# Patient Record
Sex: Female | Born: 1985
Health system: Southern US, Community
[De-identification: ages and names within clinical notes are randomized; demographics above are authoritative.]

## PROBLEM LIST (undated history)

## (undated) DIAGNOSIS — C73 Malignant neoplasm of thyroid gland: Secondary | ICD-10-CM

## (undated) DIAGNOSIS — F32A Depression, unspecified: Secondary | ICD-10-CM

## (undated) HISTORY — PX: THYROIDECTOMY: SHX17

## (undated) HISTORY — DX: Depression, unspecified: F32.A

## (undated) HISTORY — DX: Malignant neoplasm of thyroid gland: C73

---

## 2018-06-11 DIAGNOSIS — Z113 Encounter for screening for infections with a predominantly sexual mode of transmission: Secondary | ICD-10-CM | POA: Diagnosis not present

## 2018-06-11 DIAGNOSIS — Z20828 Contact with and (suspected) exposure to other viral communicable diseases: Secondary | ICD-10-CM | POA: Diagnosis not present

## 2019-04-29 ENCOUNTER — Emergency Department (HOSPITAL_BASED_OUTPATIENT_CLINIC_OR_DEPARTMENT_OTHER): Payer: 59

## 2019-04-29 ENCOUNTER — Emergency Department (HOSPITAL_COMMUNITY)
Admission: EM | Admit: 2019-04-29 | Discharge: 2019-04-29 | Disposition: A | Discharge status: Home / Self Care | Payer: 59 | Attending: Emergency Medicine | Admitting: Emergency Medicine

## 2019-04-29 ENCOUNTER — Encounter (HOSPITAL_COMMUNITY): Payer: Self-pay

## 2019-04-29 ENCOUNTER — Emergency Department (HOSPITAL_COMMUNITY): Payer: 59

## 2019-04-29 ENCOUNTER — Other Ambulatory Visit: Payer: Self-pay

## 2019-04-29 DIAGNOSIS — M79604 Pain in right leg: Secondary | ICD-10-CM | POA: Diagnosis present

## 2019-04-29 DIAGNOSIS — R079 Chest pain, unspecified: Secondary | ICD-10-CM | POA: Diagnosis not present

## 2019-04-29 DIAGNOSIS — M79609 Pain in unspecified limb: Secondary | ICD-10-CM

## 2019-04-29 DIAGNOSIS — R590 Localized enlarged lymph nodes: Secondary | ICD-10-CM | POA: Insufficient documentation

## 2019-04-29 LAB — CBC WITH DIFFERENTIAL/PLATELET
Abs Immature Granulocytes: 0.02 10*3/uL (ref 0.00–0.07)
Basophils Absolute: 0 10*3/uL (ref 0.0–0.1)
Basophils Relative: 1 %
Eosinophils Absolute: 0.1 10*3/uL (ref 0.0–0.5)
Eosinophils Relative: 2 %
HCT: 40 % (ref 36.0–46.0)
Hemoglobin: 13 g/dL (ref 12.0–15.0)
Immature Granulocytes: 0 %
Lymphocytes Relative: 33 %
Lymphs Abs: 2 10*3/uL (ref 0.7–4.0)
MCH: 31.3 pg (ref 26.0–34.0)
MCHC: 32.5 g/dL (ref 30.0–36.0)
MCV: 96.2 fL (ref 80.0–100.0)
Monocytes Absolute: 0.4 10*3/uL (ref 0.1–1.0)
Monocytes Relative: 7 %
Neutro Abs: 3.4 10*3/uL (ref 1.7–7.7)
Neutrophils Relative %: 57 %
Platelets: 323 10*3/uL (ref 150–400)
RBC: 4.16 MIL/uL (ref 3.87–5.11)
RDW: 12 % (ref 11.5–15.5)
WBC: 6 10*3/uL (ref 4.0–10.5)
nRBC: 0 % (ref 0.0–0.2)

## 2019-04-29 LAB — BASIC METABOLIC PANEL
Anion gap: 9 (ref 5–15)
BUN: 10 mg/dL (ref 6–20)
CO2: 24 mmol/L (ref 22–32)
Calcium: 8.9 mg/dL (ref 8.9–10.3)
Chloride: 105 mmol/L (ref 98–111)
Creatinine, Ser: 0.58 mg/dL (ref 0.44–1.00)
GFR calc Af Amer: >60 mL/min (ref >60)
GFR calc non Af Amer: >60 mL/min (ref >60)
Glucose, Bld: 108 mg/dL — ABNORMAL HIGH (ref 70–99)
Potassium: 3.6 mmol/L (ref 3.5–5.1)
Sodium: 138 mmol/L (ref 135–145)

## 2019-04-29 LAB — I-STAT BETA HCG BLOOD, ED (MC, WL, AP ONLY): I-stat hCG, quantitative: <5 m[IU]/mL (ref <5)

## 2019-04-29 LAB — TROPONIN I (HIGH SENSITIVITY): Troponin I (High Sensitivity): <2 ng/L (ref <18)

## 2019-04-29 MED ORDER — IOHEXOL 350 MG/ML SOLN
100.0000 mL | Freq: Once | INTRAVENOUS | Status: AC | PRN
  Administered 2019-04-29: 100 mL via INTRAVENOUS

## 2019-04-29 NOTE — Progress Notes (Signed)
Right lower extremity venous duplex completed. Refer to "CV Proc" under chart review to view preliminary results.  04/29/2019 12:23 PM Eula Fried., MHA, RVT, RDCS, RDMS

## 2019-04-29 NOTE — ED Notes (Signed)
Patient transported to CT 

## 2019-04-29 NOTE — Discharge Instructions (Addendum)
You have been seen today for right leg pain and elevated d dimer. Please read and follow all provided instructions. Return to the emergency room for worsening condition or new concerning symptoms.     -The CT scan of your chest did not show a blood clot.  It does show you have some swollen lymph nodes in your left axillary (arm pit). This could be from your recent covid vaccine as we discussed  -You also have a 9 mm thyroid nodule. Follow up is not needed for your thyroid nodule based on the size, but I wanted to let you know of the incidental finding,  1. Medications:  Recommend tylenol and ibuprofen as needed for pain.  Continue usual home medications Take medications as prescribed. Please review all of the medicines and only take them if you do not have an allergy to them.   2. Treatment: rest, drink plenty of fluids  3. Follow Up:  Please keep your primary care appointment scheduled for next month.    It is also a possibility that you have an allergic reaction to any of the medicines that you have been prescribed - Everybody reacts differently to medications and while MOST people have no trouble with most medicines, you may have a reaction such as nausea, vomiting, rash, swelling, shortness of breath. If this is the case, please stop taking the medicine immediately and contact your physician.  ?

## 2019-04-29 NOTE — ED Notes (Signed)
ED Provider at bedside. 

## 2019-04-29 NOTE — ED Notes (Signed)
US at bedside

## 2019-04-29 NOTE — ED Provider Notes (Signed)
[facility]-EMERGENCY DEPT Provider Note   CSN: [id number] Arrival date & time: 04/29/19  [id number]     History Chief Complaint  Patient presents with  . Leg Pain  . Abnormal Lab    Krystal Love is a 34 y.o. female with no known past medical history presents to emergency department today with chief complaint of right leg pain and abnormal lab.  Patient states she has had intermittent sharp pain in her right calf x4 days. She rates pain 2/10 in severity.  She went to urgent care yesterday, D-dimer was performed and is elevated at 1,690 ng/mL .  She was advised to come to the emergency department for evaluation.  Patient denies any injury, fall or trauma to the leg.  She is also endorsing intermittent chest pain times the last week.  She says it feels like heartburn is a burning sensation in her chest.  She has a history of heartburn but states this feels differently.  She has not take any medication for symptoms prior to arrival. Patient received both doses of pfizer covid vaccine with the last being administered April 3. She denies recent travel or immobilization, history of PE or DVT, cough or hemoptysis. Does not know if she has family history  of bleeding or clotting disorders. She denies personal history of bleeding or clotting disorders.  She has a Mirena IUD.  She states she quit smoking x2 years ago.  History provided by patient with additional history obtained from chart review.     History reviewed. No pertinent past medical history.  There are no problems to display for this patient.   History reviewed. No pertinent surgical history.   OB History   No obstetric history on file.     Family History  Problem Relation Age of Onset  . Hypertension Mother     Social History   Tobacco Use  . Smoking status: Never Smoker  . Smokeless tobacco: Never Used  Substance Use Topics  . Alcohol use: Yes  . Drug use: Never    Home Medications Prior to  Admission medications   Medication Sig Start Date End Date Taking? Authorizing Provider  acetaminophen (TYLENOL) 325 MG tablet Take 650 mg by mouth every 6 (six) hours as needed for mild pain or headache.   Yes [provider]  calcium carbonate (TUMS - DOSED IN MG ELEMENTAL CALCIUM) 500 MG chewable tablet Chew 1 tablet by mouth 3 (three) times daily as needed for indigestion or heartburn.   Yes [provider]  famotidine (PEPCID) 20 MG tablet Take 20 mg by mouth 2 (two) times daily as needed for heartburn or indigestion.   Yes [provider]  levonorgestrel (MIRENA) 20 MCG/24HR IUD 1 each by Intrauterine route once.   Yes [provider]  Multiple Vitamins-Minerals (ZINC PO) Take 1 tablet by mouth daily.   Yes [provider]    Allergies    Patient has no known allergies.  Review of Systems   Review of Systems  All other systems are reviewed and are negative for acute change except as noted in the HPI.   Physical Exam Updated Vital Signs BP 126/62 (BP Location: Left Arm)   Pulse 62   Temp 98.9 F (37.2 C) (Oral)   Resp 17   Ht 5\' 1"  (1.549 m)   Wt 60.8 kg   SpO2 96%   BMI 25.32 kg/m   Physical Exam Vitals and nursing note reviewed.  Constitutional:  General: She is not in acute distress.    Appearance: She is not ill-appearing.  HENT:     Head: Normocephalic and atraumatic.     Right Ear: Tympanic membrane and external ear normal.     Left Ear: Tympanic membrane and external ear normal.     Nose: Nose normal.     Mouth/Throat:     Mouth: Mucous membranes are moist.     Pharynx: Oropharynx is clear.  Eyes:     General: No scleral icterus.       Right eye: No discharge.        Left eye: No discharge.     Extraocular Movements: Extraocular movements intact.     Conjunctiva/sclera: Conjunctivae normal.     Pupils: Pupils are equal, round, and reactive to light.  Neck:     Vascular: No JVD.  Cardiovascular:     Rate  and Rhythm: Normal rate and regular rhythm.     Pulses: Normal pulses.          Radial pulses are 2+ on the right side and 2+ on the left side.     Heart sounds: Normal heart sounds.  Pulmonary:     Comments: Lungs clear to auscultation in all fields. Symmetric chest rise. No wheezing, rales, or rhonchi. Chest:     Chest wall: No tenderness.  Abdominal:     Comments: Abdomen is soft, non-distended, and non-tender in all quadrants. No rigidity, no guarding. No peritoneal signs.  Musculoskeletal:        General: Normal range of motion.     Cervical back: Normal range of motion.     Right lower leg: No edema.     Left lower leg: No edema.     Comments: Homans sign absent bilaterally, no lower extremity edema, no palpable cords, compartments are soft.  Skin:    General: Skin is warm and dry.     Capillary Refill: Capillary refill takes less than 2 seconds.  Neurological:     Mental Status: She is oriented to person, place, and time.     GCS: GCS eye subscore is 4. GCS verbal subscore is 5. GCS motor subscore is 6.     Comments: Fluent speech, no facial droop.  Psychiatric:        Behavior: Behavior normal.       ED Results / Procedures / Treatments   Labs (all labs ordered are listed, but only abnormal results are displayed) Labs Reviewed  BASIC METABOLIC PANEL - Abnormal; Notable for the following components:      Result Value   Glucose, Bld 108 (*)    All other components within normal limits  CBC WITH DIFFERENTIAL/PLATELET  I-STAT BETA HCG BLOOD, ED ([facility], [facility], [facility] ONLY)  TROPONIN I (HIGH SENSITIVITY)    EKG  Date/Time:  04/29/2019     12:20:34 Rate: 73 PR Interval: 153 QRS duration: 85 QT/QTc: 409 / 451 P-R-T axis:  83     86    79 Interpretation: Sinus rhythm. Confirmed by Dr. Davonna Belling at 12:22   Radiology CT Angio Chest PE W/Cm &/Or Wo Cm  Result Date: 04/29/2019 CLINICAL DATA:  Elevated outpatient dimer and chest pain; chest pain, nonspecific. Additional  history provided: Right leg pain for 1 week, elevated D-dimer, no other complaints. EXAM: CT ANGIOGRAPHY CHEST WITH CONTRAST TECHNIQUE: Multidetector CT imaging of the chest was performed using the standard protocol during bolus administration of intravenous contrast. Multiplanar CT image reconstructions and MIPs were  obtained to evaluate the vascular anatomy. CONTRAST:  OMNIPAQUE IOHEXOL 350 MG/ML SOLN COMPARISON:  No pertinent prior studies available for comparison. FINDINGS: CARDIOVASCULAR: Heart size within normal limits. No pericardial effusion. The thoracic aorta is normal in caliber. Satisfactory opacification of the pulmonary arteries to the segmental level. No pulmonary artery filling defect is demonstrated to suggest pulmonary embolus. MEDIASTINUM/NODES: No mediastinal or hilar lymphadenopathy. Nonspecific mildly enlarged left axillary lymph nodes measuring up to 11 mm in short axis (series 7, image 32). 9 mm right thyroid lobe nodule, not meeting consensus criteria for ultrasound follow-up. LUNGS/PLEURA: There is no airspace consolidation. No pleural effusion or pneumothorax. The central airways are grossly patent. UPPER ABDOMEN: Peripherally calcified gallstone within the gallbladder. Imaged upper abdomen otherwise unremarkable. MUSCULOSKELETAL: No acute bony abnormality. Review of the MIP images confirms the above findings. IMPRESSION: 1. No evidence of pulmonary embolus. 2. The lungs are clear without airspace consolidation. 3. Nonspecific mildly enlarged left axillary lymph nodes measuring up to 11 mm in short axis. 4. Cholecystolithiasis. Electronically Signed   By: Jackey Loge DO   On: 04/29/2019 13:41   VAS [place] LOWER EXTREMITY VENOUS (DVT) (ONLY [facility] & [facility])  Result Date: 04/29/2019  Lower Venous DVTStudy Indications: Pain.  Comparison Study: No prior study Performing Technologist: Gertie Fey MHA, RDMS, RVT, RDCS  Examination Guidelines: A complete evaluation includes B-mode imaging,  spectral Doppler, color Doppler, and power Doppler as needed of all accessible portions of each vessel. Bilateral testing is considered an integral part of a complete examination. Limited examinations for reoccurring indications may be performed as noted. The reflux portion of the exam is performed with the patient in reverse Trendelenburg.  +---------+---------------+---------+-----------+----------+--------------+ RIGHT    CompressibilityPhasicitySpontaneityPropertiesThrombus Aging +---------+---------------+---------+-----------+----------+--------------+ CFV      Full           Yes      Yes                                 +---------+---------------+---------+-----------+----------+--------------+ SFJ      Full                                                        +---------+---------------+---------+-----------+----------+--------------+ FV Prox  Full                                                        +---------+---------------+---------+-----------+----------+--------------+ FV Mid   Full                                                        +---------+---------------+---------+-----------+----------+--------------+ FV DistalFull                                                        +---------+---------------+---------+-----------+----------+--------------+ PFV  Full                                                        +---------+---------------+---------+-----------+----------+--------------+ POP      Full           Yes      Yes                                 +---------+---------------+---------+-----------+----------+--------------+ PTV      Full                                                        +---------+---------------+---------+-----------+----------+--------------+ PERO     Full                                                        +---------+---------------+---------+-----------+----------+--------------+    +----+---------------+---------+-----------+----------+--------------+ LEFTCompressibilityPhasicitySpontaneityPropertiesThrombus Aging +----+---------------+---------+-----------+----------+--------------+ CFV Full           Yes      Yes                                 +----+---------------+---------+-----------+----------+--------------+     Summary: RIGHT: - There is no evidence of deep vein thrombosis in the lower extremity.  - No cystic structure found in the popliteal fossa.  LEFT: - No evidence of common femoral vein obstruction.  *See table(s) above for measurements and observations.    Preliminary     Procedures Procedures (including critical care time)  Medications Ordered in ED Medications  iohexol (OMNIPAQUE) 350 MG/ML injection 100 mL (100 mLs Intravenous Contrast Given 04/29/19 1249)    ED Course  I have reviewed the triage vital signs and the nursing notes.  Pertinent labs & imaging results that were available during my care of the patient were reviewed by me and considered in my medical decision making (see chart for details).    MDM Rules/Calculators/A&P                      Patient seen and examined. Patient presents awake, alert, hemodynamically stable, afebrile, non toxic.  No tachycardia or hypoxia.  She is presenting with right calf pain and elevated D-dimer collected yesterday at urgent care.  On exam no lower extremity edema appreciated.  Negative Homans' sign bilaterally.  Right lower extremity is neurovascularly intact distally.  Lungs are clear to auscultation all fields.  Normal work of breathing.  As she is reporting intermittent chest pain in the setting of an elevated dimer will add on troponin and CTA chest to r/o PE.  [place] negative for DVT.  Labs here are unremarkable including CBC, BMP.  Troponin less than 2.  Pregnancy test is negative. EKG without sign of ischemia, normal sinus rhythm.  CTA is negative for PE.  It does show a 9 mm thyroid nodule  and nonspecific mildly enlarged left axillary lymph  nodes.  Discussed results with patient.  She has not had any recent viral illness however did receive her Covid vaccines this month which could contribute to the left axillary lymph node enlargement.  On exam she does have mildly enlarged lymph nodes in the left axillary however does not have any in cervical or inguinal regions.  Patient has a PCP appointment scheduled for next month and I recommend she keep [place] follow-up for further evaluation and recheck of her axillary lymph nodes.  Based on the size of the thyroid nodule further work-up is not indicated per [employer] [facility].  The patient appears reasonably screened and/or stabilized for discharge and I doubt any other medical condition or other [facility] requiring further screening, evaluation, or treatment in the ED at this time prior to discharge. The patient is safe for discharge with strict return precautions discussed. Findings and plan of care discussed with supervising physician Dr. Rubin Payor.   Portions of this note were generated with Scientist, clinical (histocompatibility and immunogenetics). Dictation errors may occur despite best attempts at proofreading.   Final Clinical Impression(s) / ED Diagnoses Final diagnoses:  Right leg pain  Chest pain, unspecified type  Axillary lymphadenopathy    Rx / DC Orders ED Discharge Orders    None       Kathyrn Lass 04/29/19 1619    Benjiman Core, MD 05/01/19 [phone]

## 2019-04-29 NOTE — ED Triage Notes (Signed)
Patient states she has pain in her right shin and calf area. No swelling. Patient states she had a d-dimer yesterday which was 16.90.

## 2019-12-01 DIAGNOSIS — E041 Nontoxic single thyroid nodule: Secondary | ICD-10-CM | POA: Diagnosis not present

## 2019-12-01 DIAGNOSIS — G43009 Migraine without aura, not intractable, without status migrainosus: Secondary | ICD-10-CM | POA: Diagnosis not present

## 2019-12-17 DIAGNOSIS — Z20822 Contact with and (suspected) exposure to covid-19: Secondary | ICD-10-CM | POA: Diagnosis not present

## 2019-12-17 DIAGNOSIS — Z03818 Encounter for observation for suspected exposure to other biological agents ruled out: Secondary | ICD-10-CM | POA: Diagnosis not present

## 2020-01-07 DIAGNOSIS — Z20822 Contact with and (suspected) exposure to covid-19: Secondary | ICD-10-CM | POA: Diagnosis not present

## 2020-01-15 DIAGNOSIS — Z20822 Contact with and (suspected) exposure to covid-19: Secondary | ICD-10-CM | POA: Diagnosis not present

## 2020-01-20 DIAGNOSIS — Z1159 Encounter for screening for other viral diseases: Secondary | ICD-10-CM | POA: Diagnosis not present

## 2020-01-20 DIAGNOSIS — Z113 Encounter for screening for infections with a predominantly sexual mode of transmission: Secondary | ICD-10-CM | POA: Diagnosis not present

## 2020-01-20 DIAGNOSIS — Z114 Encounter for screening for human immunodeficiency virus [HIV]: Secondary | ICD-10-CM | POA: Diagnosis not present

## 2021-04-21 IMAGING — CT CT ANGIO CHEST
2 of 6 series · 18 of 36 positions shown · IV contrast (omnipaque)
Comparison: No pertinent prior studies available for comparison.

CLINICAL DATA: Elevated outpatient dimer and chest pain; chest
pain, nonspecific. Additional history provided: Right leg pain for 1
week, elevated D-dimer, no other complaints.

EXAM:
CT ANGIOGRAPHY CHEST WITH CONTRAST
TECHNIQUE: Multidetector CT imaging of the chest was performed using the
standard protocol during bolus administration of intravenous
contrast. Multiplanar CT image reconstructions and MIPs were
obtained to evaluate the vascular anatomy.
CONTRAST:  100mL OMNIPAQUE IOHEXOL 350 MG/ML SOLN

[Series 5: thins · axial · 0.60mm/px · z∈[+1172,+1413]mm · 17 of 273 slices shown]
[im 16/273  lung]
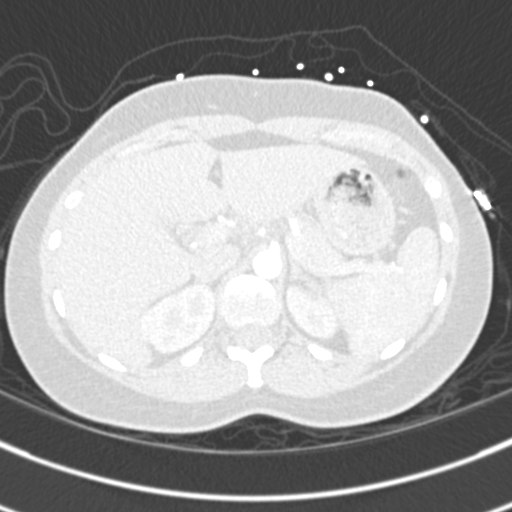
[im 31/273  mediastinal]
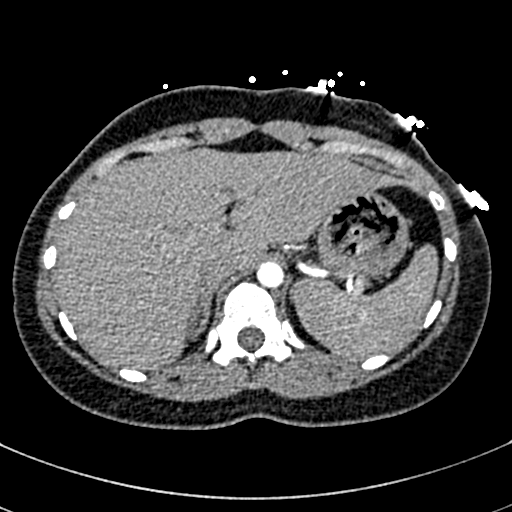
[im 46/273  lung]
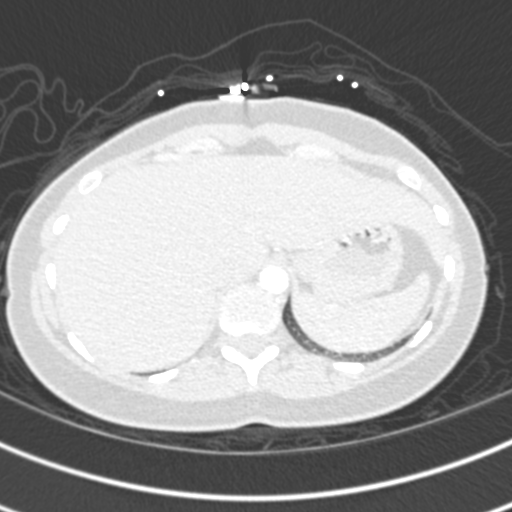
[im 61/273  mediastinal]
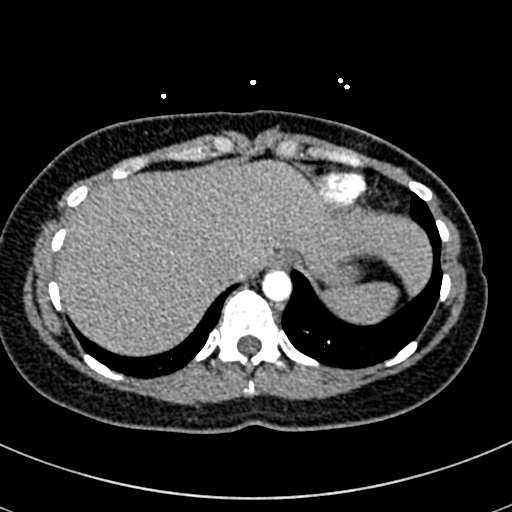
[im 76/273  lung]
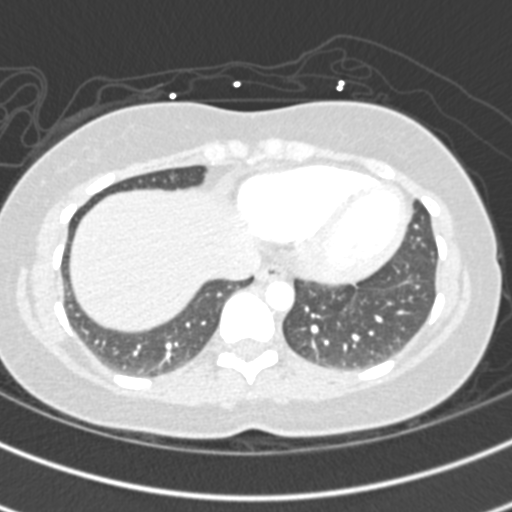
[im 91/273  mediastinal]
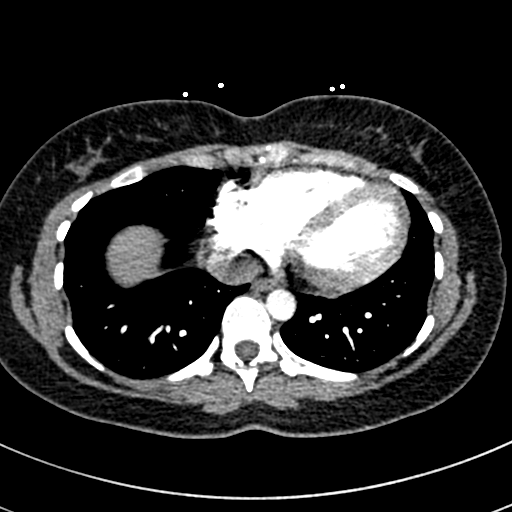
[im 106/273  lung]
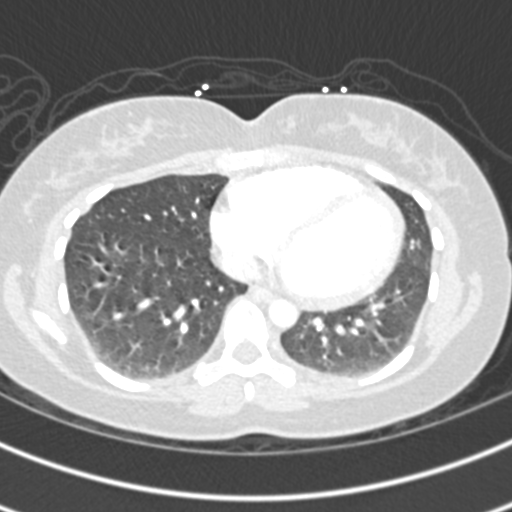
[im 121/273  mediastinal]
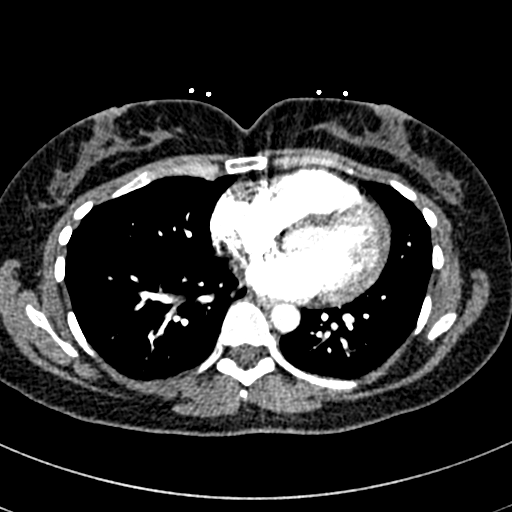
[im 137/273  lung]
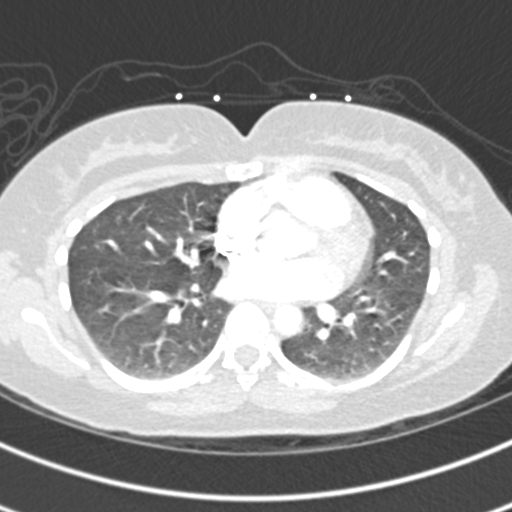
[im 152/273  mediastinal]
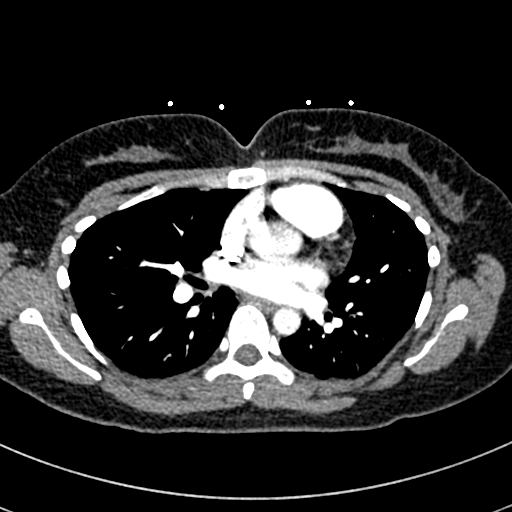
[im 167/273  lung]
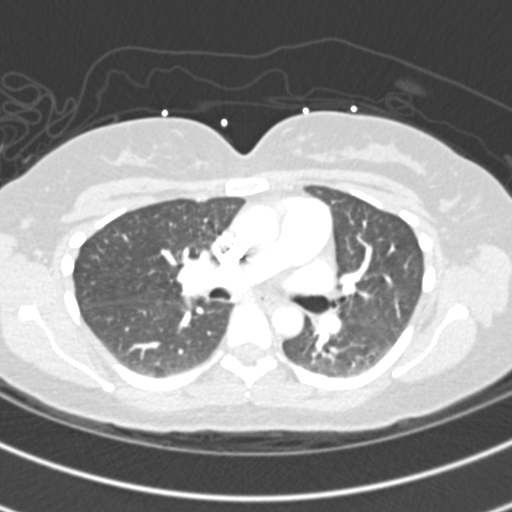
[im 182/273  mediastinal]
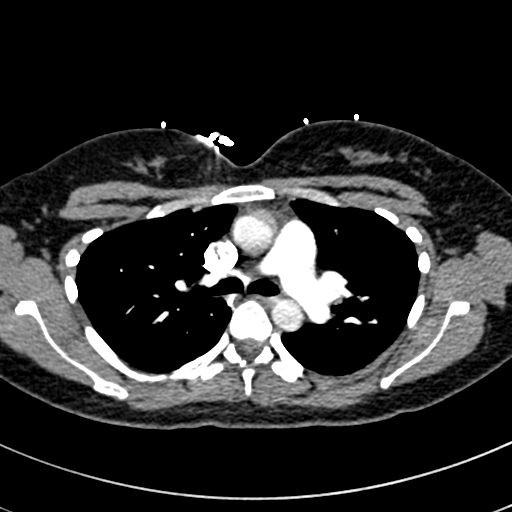
[im 197/273  lung]
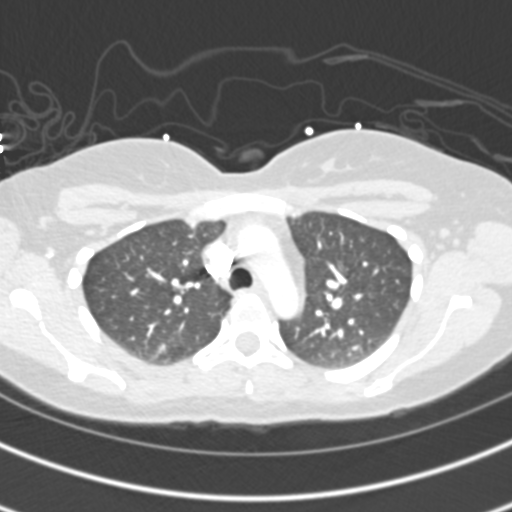
[im 212/273  mediastinal]
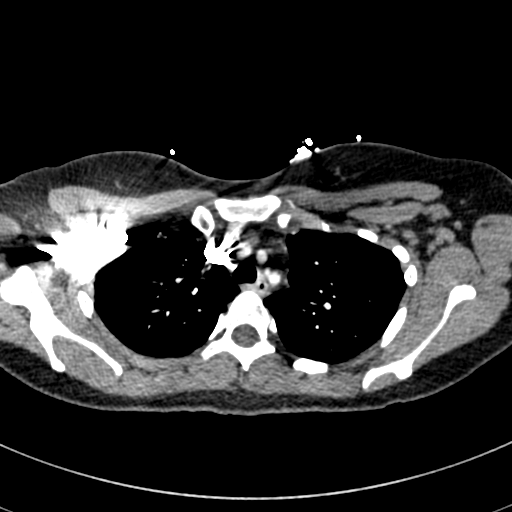
[im 227/273  lung]
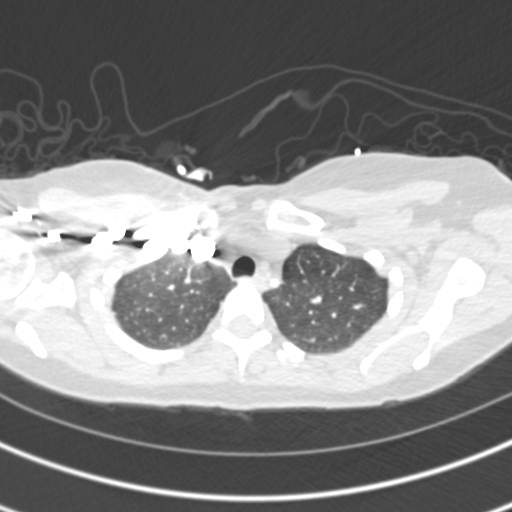
[im 242/273  mediastinal]
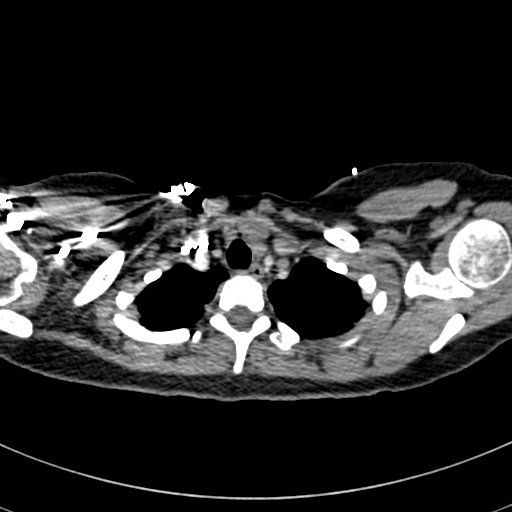
[im 257/273  lung]
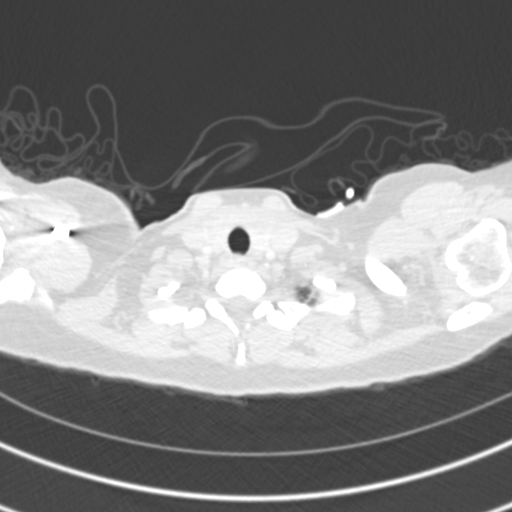

[Series 7: coronal mpr · coronal · 0.55mm/px · 1 of 99 slices shown]
[im 50/99  mediastinal]
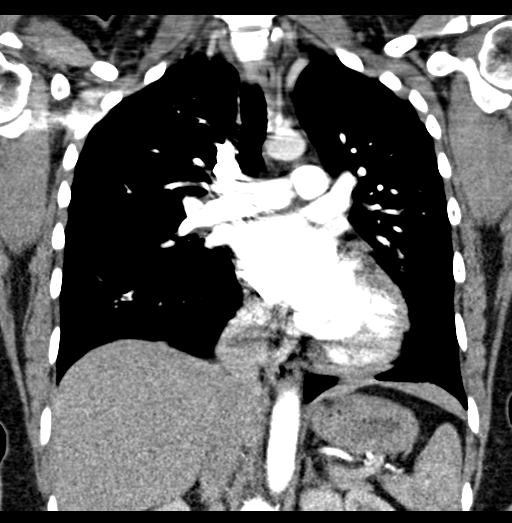

[18 of 36 positions shown; findings below may reference images not displayed]

FINDINGS: CARDIOVASCULAR: Heart size within normal limits. No pericardial
effusion. The thoracic aorta is normal in caliber. Satisfactory
opacification of the pulmonary arteries to the segmental level. No
pulmonary artery filling defect is demonstrated to suggest pulmonary
embolus.

MEDIASTINUM/NODES:

No mediastinal or hilar lymphadenopathy. Nonspecific mildly enlarged
left axillary lymph nodes measuring up to 11 mm in short axis
(series 7, image 32).

9 mm right thyroid lobe nodule, not meeting consensus criteria for
ultrasound follow-up.

LUNGS/PLEURA: There is no airspace consolidation. No pleural
effusion or pneumothorax. The central airways are grossly patent.

UPPER ABDOMEN: Peripherally calcified gallstone within the
gallbladder. Imaged upper abdomen otherwise unremarkable.

MUSCULOSKELETAL: No acute bony abnormality.

Review of the MIP images confirms the above findings.
IMPRESSION: 1. No evidence of pulmonary embolus.
2. The lungs are clear without airspace consolidation.
3. Nonspecific mildly enlarged left axillary lymph nodes measuring
up to 11 mm in short axis.
4. Cholecystolithiasis.

## 2021-10-06 ENCOUNTER — Other Ambulatory Visit: Payer: Self-pay | Admitting: Family Medicine

## 2021-10-06 DIAGNOSIS — E041 Nontoxic single thyroid nodule: Secondary | ICD-10-CM

## 2021-10-14 ENCOUNTER — Ambulatory Visit
Admission: RE | Admit: 2021-10-14 | Discharge: 2021-10-14 | Disposition: A | Discharge status: Home / Self Care | Payer: 59 | Source: Ambulatory Visit | Attending: Family Medicine | Admitting: Family Medicine

## 2021-10-14 DIAGNOSIS — E041 Nontoxic single thyroid nodule: Secondary | ICD-10-CM

## 2022-03-22 ENCOUNTER — Encounter: Payer: 59 | Admitting: Obstetrics & Gynecology

## 2022-09-13 ENCOUNTER — Other Ambulatory Visit: Payer: Self-pay | Admitting: Internal Medicine

## 2022-09-13 DIAGNOSIS — E041 Nontoxic single thyroid nodule: Secondary | ICD-10-CM

## 2022-09-13 DIAGNOSIS — Z8349 Family history of other endocrine, nutritional and metabolic diseases: Secondary | ICD-10-CM

## 2022-09-13 DIAGNOSIS — E063 Autoimmune thyroiditis: Secondary | ICD-10-CM

## 2022-10-03 ENCOUNTER — Other Ambulatory Visit (HOSPITAL_COMMUNITY): Payer: Self-pay

## 2022-10-03 MED ORDER — AMPHETAMINE-DEXTROAMPHET ER 15 MG PO CP24
15.0000 mg | ORAL_CAPSULE | Freq: Every morning | ORAL | 0 refills | Status: DC
  Filled 2022-10-03: qty 30, 30d supply, fill #0

## 2022-10-05 ENCOUNTER — Other Ambulatory Visit (HOSPITAL_COMMUNITY): Payer: Self-pay

## 2022-10-05 MED ORDER — COVID-19 MRNA VAC-TRIS(PFIZER) 30 MCG/0.3ML IM SUSY
0.3000 mL | PREFILLED_SYRINGE | Freq: Once | INTRAMUSCULAR | 0 refills | Status: AC
  Filled 2022-10-05: qty 0.3, 1d supply, fill #0

## 2022-10-05 MED ORDER — INFLUENZA VIRUS VACC SPLIT PF (FLUZONE) 0.5 ML IM SUSY
0.5000 mL | PREFILLED_SYRINGE | Freq: Once | INTRAMUSCULAR | 0 refills | Status: AC
  Filled 2022-10-05: qty 0.5, 1d supply, fill #0

## 2022-10-06 ENCOUNTER — Other Ambulatory Visit (HOSPITAL_COMMUNITY): Payer: Self-pay

## 2022-10-16 ENCOUNTER — Other Ambulatory Visit (HOSPITAL_COMMUNITY): Payer: Self-pay

## 2022-11-01 ENCOUNTER — Other Ambulatory Visit: Payer: Self-pay

## 2022-11-01 ENCOUNTER — Other Ambulatory Visit (HOSPITAL_COMMUNITY): Payer: Self-pay

## 2022-11-01 MED ORDER — AMPHETAMINE-DEXTROAMPHET ER 15 MG PO CP24
15.0000 mg | ORAL_CAPSULE | Freq: Every morning | ORAL | 0 refills | Status: DC
  Filled 2022-11-01: qty 30, 30d supply, fill #0

## 2022-11-01 MED ORDER — AMPHETAMINE-DEXTROAMPHET ER 15 MG PO CP24
15.0000 mg | ORAL_CAPSULE | Freq: Every morning | ORAL | 0 refills | Status: DC
  Filled 2022-12-09: qty 30, 30d supply, fill #0

## 2022-11-03 ENCOUNTER — Other Ambulatory Visit (HOSPITAL_COMMUNITY): Payer: Self-pay

## 2022-12-09 ENCOUNTER — Other Ambulatory Visit (HOSPITAL_COMMUNITY): Payer: Self-pay

## 2022-12-22 ENCOUNTER — Other Ambulatory Visit (HOSPITAL_COMMUNITY): Payer: Self-pay

## 2022-12-22 ENCOUNTER — Other Ambulatory Visit: Payer: Self-pay

## 2022-12-22 MED ORDER — AMPHETAMINE-DEXTROAMPHET ER 15 MG PO CP24: 15.0000 mg | ORAL_CAPSULE | Freq: Every morning | ORAL | 0 refills | Status: DC

## 2022-12-22 MED ORDER — AMPHETAMINE-DEXTROAMPHET ER 15 MG PO CP24
15.0000 mg | ORAL_CAPSULE | Freq: Every morning | ORAL | 0 refills | Status: DC
  Filled 2023-01-18: qty 30, 30d supply, fill #0

## 2023-01-18 ENCOUNTER — Other Ambulatory Visit (HOSPITAL_COMMUNITY): Payer: Self-pay

## 2023-01-19 ENCOUNTER — Other Ambulatory Visit (HOSPITAL_COMMUNITY): Payer: Self-pay

## 2023-02-16 ENCOUNTER — Other Ambulatory Visit (HOSPITAL_COMMUNITY): Payer: Self-pay

## 2023-02-16 MED ORDER — LISDEXAMFETAMINE DIMESYLATE 30 MG PO CAPS
30.0000 mg | ORAL_CAPSULE | Freq: Every morning | ORAL | 0 refills | Status: DC
  Filled 2023-02-16: qty 30, 30d supply, fill #0

## 2023-02-21 ENCOUNTER — Other Ambulatory Visit (HOSPITAL_COMMUNITY): Payer: Self-pay

## 2023-02-21 MED ORDER — AMPHETAMINE-DEXTROAMPHET ER 10 MG PO CP24
10.0000 mg | ORAL_CAPSULE | Freq: Every day | ORAL | 0 refills | Status: AC
  Filled 2023-02-21: qty 30, 30d supply, fill #0

## 2023-04-28 ENCOUNTER — Other Ambulatory Visit (HOSPITAL_COMMUNITY): Payer: Self-pay

## 2023-05-01 ENCOUNTER — Other Ambulatory Visit (HOSPITAL_COMMUNITY): Payer: Self-pay

## 2023-05-01 MED ORDER — AMPHETAMINE-DEXTROAMPHET ER 10 MG PO CP24: 10.0000 mg | ORAL_CAPSULE | Freq: Every day | ORAL | 0 refills | Status: DC

## 2023-05-01 MED ORDER — AMPHETAMINE-DEXTROAMPHET ER 10 MG PO CP24
10.0000 mg | ORAL_CAPSULE | Freq: Every day | ORAL | 0 refills | Status: DC
Start: 2023-06-28 — End: 2023-05-10

## 2023-05-01 MED ORDER — AMPHETAMINE-DEXTROAMPHET ER 10 MG PO CP24
10.0000 mg | ORAL_CAPSULE | Freq: Every day | ORAL | 0 refills | Status: DC
  Filled 2023-05-01: qty 30, 30d supply, fill #0

## 2023-06-28 ENCOUNTER — Other Ambulatory Visit (HOSPITAL_COMMUNITY): Payer: Self-pay

## 2023-07-04 ENCOUNTER — Other Ambulatory Visit: Payer: Self-pay

## 2023-07-04 ENCOUNTER — Other Ambulatory Visit (HOSPITAL_COMMUNITY): Payer: Self-pay

## 2023-07-04 MED ORDER — AMPHETAMINE-DEXTROAMPHET ER 10 MG PO CP24
10.0000 mg | ORAL_CAPSULE | Freq: Every day | ORAL | 0 refills | Status: AC
  Filled 2023-07-04: qty 30, 30d supply, fill #0

## 2023-07-05 ENCOUNTER — Other Ambulatory Visit (HOSPITAL_COMMUNITY): Payer: Self-pay

## 2023-07-24 ENCOUNTER — Other Ambulatory Visit (HOSPITAL_COMMUNITY): Payer: Self-pay

## 2023-07-24 MED ORDER — LEVOTHYROXINE SODIUM 25 MCG PO TABS
25.0000 ug | ORAL_TABLET | ORAL | 3 refills | Status: AC
  Filled 2023-07-24: qty 90, 90d supply, fill #0
  Filled 2023-11-27: qty 90, 90d supply, fill #1

## 2023-07-26 ENCOUNTER — Other Ambulatory Visit (HOSPITAL_COMMUNITY): Payer: Self-pay

## 2023-07-26 MED ORDER — AMPHETAMINE-DEXTROAMPHET ER 10 MG PO CP24: 10.0000 mg | ORAL_CAPSULE | Freq: Every day | ORAL | 0 refills | Status: AC

## 2023-07-26 MED ORDER — AMPHETAMINE-DEXTROAMPHET ER 10 MG PO CP24: 10.0000 mg | ORAL_CAPSULE | Freq: Every day | ORAL | 0 refills | Status: DC

## 2023-07-26 MED ORDER — AMPHETAMINE-DEXTROAMPHET ER 10 MG PO CP24
10.0000 mg | ORAL_CAPSULE | Freq: Every day | ORAL | 0 refills | Status: DC
  Filled 2023-08-16: qty 30, 30d supply, fill #0

## 2023-08-16 ENCOUNTER — Other Ambulatory Visit (HOSPITAL_COMMUNITY): Payer: Self-pay

## 2023-08-17 ENCOUNTER — Other Ambulatory Visit: Payer: Self-pay

## 2023-08-30 ENCOUNTER — Encounter: Payer: Self-pay | Admitting: Neurology

## 2023-11-14 NOTE — Progress Notes (Unsigned)
 NEUROLOGY CONSULTATION NOTE  GREGORY DOWE MRN: [id number] DOB: 11-11-1985  Referring provider: Charmaine Bright, PA-C Primary care provider: Charmaine Bright, PA-C  Reason for consult:  headache  Assessment/Plan:   Migraine without aura, without status migrainosus, intractable  Migraine prevention:  Lifestyle modification Migraine rescue:  She will try samples of Nurtec Lifestyle modification: Limit use of pain relievers to no more than 9 days out of the month to prevent risk of rebound or medication-overuse headache. Diet modification/hydration/caffeine cessation Routine exercise Sleep hygiene Consider vitamins/supplements:  magnesium citrate 400mg  daily, riboflavin 400mg  daily, CoQ10 100mg  three times daily Keep headache diary Follow up 7 months.    Subjective:  Krystal Love is a 38 year old right-handed female with ADHD and history of Hashimoto's thyroiditis and thyroid  cancer s/p partial thyroidectomy who presents for headache.  History supplemented by referring provider's note.   Onset:  highschool.  Increased frequency and duration since approximately 2023 Location:  unilateral retro-orbital (usually right eye) Quality:  pressure, less likely stabbing Intensity:  5-7/10.  Aura:  absent Prodrome:  absent Associated symptoms:  Photophobia, osmophobia, sometimes Nausea (if more severe).  She denies associated vomiting now, unilateral numbness or weakness. Duration:  2-3 days (fluctuating severity) Frequency:  2 a month Triggers:  change in barometric pressure, emotional stress, strong smells Relieving factors:  sleep Activity:  aggravates but still functional    Past medications: Past NSAIDS/analgesics:  Midrin Past abortive triptans:  sumatriptan 100mg  Past abortive ergotamine:  none Past muscle relaxants:  baclofen Past anti-emetic:  none Past antihypertensive medications:  none Past antidepressant medications:  sertraline, escitalopram, Prozac,  Wellbutrin Past anticonvulsant medications:  none Past anti-CGRP:  none Past vitamins/Herbal/Supplements:  none Past antihistamines/decongestants:  Dramamine Other past therapies:  none  Current medications: Current NSAIDS/analgesics:  Tylenol, Excedrin Migraine (usually effective) Current triptans:  none Current ergotamine:  none Current anti-emetic:  none Current muscle relaxants:  none Current Antihypertensive medications:  none Current Antidepressant medications:  none Current Anticonvulsant medications:  none Current anti-CGRP:  none Current Vitamins/Herbal/Supplements:  D, MVI Current Antihistamines/Decongestants:  none Other therapy:  none Birth control:  Mirena Other medications:  levothyroxine , Adderall ER 10mg  daily, acyclovir   Caffeine:  1-2 cups coffee daily, 1/2 cola sometimes Alcohol:  no Diet:  needs to increase water intake.  Poor intake.  Breakfast burrito, soup, bagel egg and cheese sandwich, sandwich, rice, vegetable, protein Exercise:  no Depression:  no; Anxiety:  yes Sleep hygiene:  varies, lately good  History of TBI/concussion:  none Family history of headache:  paternal grandfather (migraines), maternal grandfather (possibly migraines) Family history of cerebral aneurysm:  none Other family history:  mom (stroke)      PAST MEDICAL HISTORY: No past medical history on file.  PAST SURGICAL HISTORY: No past surgical history on file.  MEDICATIONS: Current Outpatient Medications on File Prior to Visit  Medication Sig Dispense Refill   acetaminophen (TYLENOL) 325 MG tablet Take 650 mg by mouth every 6 (six) hours as needed for mild pain or headache.     amphetamine -dextroamphetamine  (ADDERALL XR) 10 MG 24 hr capsule Take 1 capsule (10 mg total) by mouth daily. 30 capsule 0   amphetamine -dextroamphetamine  (ADDERALL XR) 10 MG 24 hr capsule Take 1 capsule (10 mg total) by mouth daily. 30 capsule 0   amphetamine -dextroamphetamine  (ADDERALL XR) 10 MG  24 hr capsule Take 1 capsule (10 mg total) by mouth daily. 30 capsule 0   amphetamine -dextroamphetamine  (ADDERALL XR) 15 MG 24 hr capsule Take 1  capsule by mouth every morning. 30 capsule 0   amphetamine -dextroamphetamine  (ADDERALL XR) 15 MG 24 hr capsule Take 1 capsule by mouth every morning. 30 capsule 0   calcium carbonate (TUMS - DOSED IN MG ELEMENTAL CALCIUM) 500 MG chewable tablet Chew 1 tablet by mouth 3 (three) times daily as needed for indigestion or heartburn.     famotidine (PEPCID) 20 MG tablet Take 20 mg by mouth 2 (two) times daily as needed for heartburn or indigestion.     levonorgestrel (MIRENA) 20 MCG/24HR IUD 1 each by Intrauterine route once.     levothyroxine  (SYNTHROID ) 25 MCG tablet Take 1 tablet (25 mcg) by mouth 5 days a week. Take only with water. Wait 30 minutes before eating food. 90 tablet 3   Multiple Vitamins-Minerals (ZINC PO) Take 1 tablet by mouth daily.     [DISCONTINUED] lisdexamfetamine (VYVANSE ) 30 MG capsule Take 1 capsule (30 mg total) by mouth every morning. 30 capsule 0   No current facility-administered medications on file prior to visit.    ALLERGIES: No Known Allergies  FAMILY HISTORY: Family History  Problem Relation Age of Onset   Hypertension Mother     Objective:  Blood pressure 104/70, pulse 89, height 5' 1 (1.549 m), weight 121 lb (54.9 kg), SpO2 95%. General: No acute distress.  Patient appears well-groomed.   Head:  Normocephalic/atraumatic Eyes:  fundi examined but not visualized Neck: supple, no paraspinal tenderness, full range of motion Heart: regular rate and rhythm Neurological Exam: Mental status: alert and oriented to person, place, and time, speech fluent and not dysarthric, language intact. Cranial nerves: CN I: not tested CN II: pupils equal, round and reactive to light, visual fields intact CN III, IV, VI:  full range of motion, no nystagmus, no ptosis CN V: facial sensation intact. CN VII: upper and lower face  symmetric CN VIII: hearing intact CN IX, X: gag intact, uvula midline CN XI: sternocleidomastoid and trapezius muscles intact CN XII: tongue midline Bulk & Tone: normal, no fasciculations. Motor:  muscle strength 5/5 throughout Sensation:  Pinprick and vibratory sensation intact. Deep Tendon Reflexes:  2+ throughout,  toes downgoing.   Finger to nose testing:  Without dysmetria.   Gait:  Normal station and stride.  Romberg negative.    Thank you for allowing me to take part in the care of this patient.  Juliene Dunnings, DO  CC: Charmaine Bright, PA-C

## 2023-11-15 ENCOUNTER — Ambulatory Visit: Admitting: Neurology

## 2023-11-15 ENCOUNTER — Encounter: Payer: Self-pay | Admitting: Neurology

## 2023-11-15 VITALS — BP 104/70 | HR 89 | Ht 61.0 in | Wt 121.0 lb

## 2023-11-15 DIAGNOSIS — G43019 Migraine without aura, intractable, without status migrainosus: Secondary | ICD-10-CM | POA: Diagnosis not present

## 2023-11-15 NOTE — Progress Notes (Signed)
 Medication Samples have been provided to the patient.  Drug name: Nurtec       Strength: 75 mg        Qty: 3  LOT: [id number]  Exp.Date: 11/28  Dosing instructions: as needed  The patient has been instructed regarding the correct time, dose, and frequency of taking this medication, including desired effects and most common side effects.   Krystal Love 11:54 AM 11/15/2023

## 2023-11-15 NOTE — Patient Instructions (Signed)
  Take NURTEC at earliest onset of headache.  Maximum 1 tablet in 24 hours. Limit use of pain relievers to no more than 9 days out of the month.  These medications include acetaminophen, NSAIDs (ibuprofen/Advil/Motrin, naproxen/Aleve, triptans (Imitrex/sumatriptan), Excedrin, and narcotics.  This will help reduce risk of rebound headaches. Be aware of common food triggers:  - Caffeine:  coffee, black tea, cola, Mt. Dew  - Chocolate  - Dairy:  aged cheeses (brie, blue, cheddar, gouda, Parmasan, provolone, romano, Swiss, etc), chocolate milk, buttermilk, sour cream, limit eggs and yogurt  - Nuts, peanut butter  - Alcohol  - Cereals/grains:  FRESH breads (fresh bagels, sourdough, doughnuts), yeast productions  - Processed/canned/aged/cured meats (pre-packaged deli meats, hotdogs)  - MSG/glutamate:  soy sauce, flavor enhancer, pickled/preserved/marinated foods  - Sweeteners:  aspartame (Equal, Nutrasweet).  Sugar and Splenda are okay  - Vegetables:  legumes (lima beans, lentils, snow peas, fava beans, pinto peans, peas, garbanzo beans), sauerkraut, onions, olives, pickles  - Fruit:  avocados, bananas, citrus fruit (orange, lemon, grapefruit), mango  - Other:  Frozen meals, macaroni and cheese Routine exercise Stay adequately hydrated (aim for 64 oz water daily) Keep headache diary Maintain proper stress management Maintain proper sleep hygiene Do not skip meals Consider supplements:  magnesium citrate 400mg  daily, riboflavin 400mg  daily, coenzyme Q10 100mg  three times daily.

## 2023-11-27 ENCOUNTER — Other Ambulatory Visit (HOSPITAL_COMMUNITY): Payer: Self-pay

## 2023-12-03 ENCOUNTER — Other Ambulatory Visit (HOSPITAL_COMMUNITY): Payer: Self-pay

## 2023-12-03 MED ORDER — AMPHETAMINE-DEXTROAMPHET ER 10 MG PO CP24
10.0000 mg | ORAL_CAPSULE | Freq: Every day | ORAL | 0 refills | Status: AC
  Filled 2023-12-03: qty 30, 30d supply, fill #0

## 2023-12-26 ENCOUNTER — Other Ambulatory Visit (HOSPITAL_COMMUNITY): Payer: Self-pay

## 2023-12-26 MED ORDER — AMPHETAMINE-DEXTROAMPHET ER 10 MG PO CP24
10.0000 mg | ORAL_CAPSULE | Freq: Every day | ORAL | 0 refills | Status: AC
  Filled 2024-01-01: qty 30, 30d supply, fill #0

## 2023-12-27 ENCOUNTER — Other Ambulatory Visit (HOSPITAL_COMMUNITY): Payer: Self-pay

## 2024-01-01 ENCOUNTER — Other Ambulatory Visit (HOSPITAL_COMMUNITY): Payer: Self-pay

## 2024-01-28 ENCOUNTER — Other Ambulatory Visit (HOSPITAL_COMMUNITY): Payer: Self-pay

## 2024-01-28 MED ORDER — DESONIDE 0.05 % EX OINT
1.0000 | TOPICAL_OINTMENT | Freq: Every day | CUTANEOUS | 0 refills | Status: AC
  Filled 2024-01-28: qty 15, 15d supply, fill #0

## 2024-01-28 MED ORDER — TACROLIMUS 0.1 % EX OINT
1.0000 | TOPICAL_OINTMENT | Freq: Two times a day (BID) | CUTANEOUS | 2 refills | Status: AC
  Filled 2024-01-28: qty 60, 30d supply, fill #0

## 2024-01-29 ENCOUNTER — Other Ambulatory Visit (HOSPITAL_COMMUNITY): Payer: Self-pay

## 2024-01-30 ENCOUNTER — Other Ambulatory Visit (HOSPITAL_COMMUNITY): Payer: Self-pay

## 2024-02-04 ENCOUNTER — Other Ambulatory Visit (HOSPITAL_COMMUNITY): Payer: Self-pay

## 2024-02-09 ENCOUNTER — Other Ambulatory Visit (HOSPITAL_COMMUNITY): Payer: Self-pay

## 2024-02-09 MED ORDER — ACYCLOVIR 400 MG PO TABS
400.0000 mg | ORAL_TABLET | Freq: Three times a day (TID) | ORAL | 3 refills | Status: AC
  Filled 2024-02-09: qty 15, 5d supply, fill #0

## 2024-02-14 ENCOUNTER — Other Ambulatory Visit (HOSPITAL_COMMUNITY): Payer: Self-pay

## 2024-06-16 ENCOUNTER — Ambulatory Visit: Admitting: Neurology
